# Patient Record
Sex: Female | Born: 1995 | Race: White | Hispanic: No | Marital: Single | Smoking: Former smoker
Health system: Southern US, Community
[De-identification: ages and names within clinical notes are randomized; demographics above are authoritative.]

## PROBLEM LIST (undated history)

## (undated) ENCOUNTER — Inpatient Hospital Stay (HOSPITAL_COMMUNITY): Payer: Self-pay

## (undated) DIAGNOSIS — J020 Streptococcal pharyngitis: Secondary | ICD-10-CM

## (undated) DIAGNOSIS — Z87440 Personal history of urinary (tract) infections: Secondary | ICD-10-CM

## (undated) HISTORY — PX: NO PAST SURGERIES: SHX2092

---
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
## 2014-09-11 ENCOUNTER — Ambulatory Visit (INDEPENDENT_AMBULATORY_CARE_PROVIDER_SITE_OTHER): Payer: BLUE CROSS/BLUE SHIELD | Admitting: Obstetrics & Gynecology

## 2014-09-11 ENCOUNTER — Encounter: Payer: Self-pay | Admitting: Obstetrics & Gynecology

## 2014-09-11 VITALS — BP 128/78 | HR 92 | Ht 68.0 in | Wt 123.2 lb

## 2014-09-11 DIAGNOSIS — Z36 Encounter for antenatal screening of mother: Secondary | ICD-10-CM

## 2014-09-11 DIAGNOSIS — Z3401 Encounter for supervision of normal first pregnancy, first trimester: Secondary | ICD-10-CM

## 2014-09-11 LAB — OB RESULTS CONSOLE GC/CHLAMYDIA
Chlamydia: NEGATIVE
Gonorrhea: NEGATIVE

## 2014-09-11 NOTE — Progress Notes (Signed)
Bedside ultrasound shows CRL of 7 weeks.  This is within a week of her LMP dating [facility] Positive fetal heart rate seen and heard on U/S.

## 2014-09-11 NOTE — Progress Notes (Signed)
   Subjective:    Joanne Newton is a G1P0 [id number] being seen today for her first obstetrical visit.  Her obstetrical history is significant for none. Patient does intend to breast feed. Pregnancy history fully reviewed.  Patient reports no complaints.  Filed Vitals:   09/11/14 0903 09/11/14 0904  BP: 128/78   Pulse: 92   Height:  5\' 8"  (1.727 m)  Weight: 123 lb 3.2 oz (55.883 kg)     HISTORY: OB History  Gravida Para Term Preterm AB SAB TAB Ectopic Multiple Living  1             # Outcome Date GA Lbr Len/2nd Weight Sex Delivery Anes PTL Lv  1 Current              Past Medical History  Diagnosis Date  . Strep pharyngitis   . Otalgia   . History of frequent urinary tract infections    History reviewed. No pertinent past surgical history. History reviewed. No pertinent family history.   Exam    Uterus:     Pelvic Exam:    Perineum: No Hemorrhoids   Vulva: normal   Vagina:  normal mucosa   pH:    Cervix: anteverted   Adnexa: normal adnexa   Bony Pelvis: android  System: Breast:  normal appearance, no masses or tenderness   Skin: normal coloration and turgor, no rashes    Neurologic: oriented   Extremities: normal strength, tone, and muscle mass   HEENT PERRLA   Mouth/Teeth mucous membranes moist, pharynx normal without lesions   Neck supple   Cardiovascular: regular rate and rhythm   Respiratory:  appears well, vitals normal, no respiratory distress, acyanotic, normal RR, ear and throat exam is normal, neck free of mass or lymphadenopathy, chest clear, no wheezing, crepitations, rhonchi, normal symmetric air entry   Abdomen: soft, non-tender; bowel sounds normal; no masses,  no organomegaly   Urinary: urethral meatus normal      Assessment:    Pregnancy: G1P0 There are no active problems to display for this patient.       Plan:     Initial labs drawn. Prenatal vitamins. Problem list reviewed and updated. We will discuss her genetic testing  options at her next visit. Neither she nor FOB have a FH of genetic diseases.  Ultrasound discussed; fetal survey: requested.  Follow up in 4 weeks. Discussed rec'd weight gain in pregnancy Palmer Fahrner C. 09/11/2014

## 2014-09-12 LAB — OBSTETRIC PANEL
Antibody Screen: NEGATIVE
BASOS PCT: 0 % (ref 0–1)
Basophils Absolute: 0 10*3/uL (ref 0.0–0.1)
EOS ABS: 0.2 10*3/uL (ref 0.0–0.7)
Eosinophils Relative: 2 % (ref 0–5)
HCT: 37.5 % (ref 36.0–46.0)
HEMOGLOBIN: 12.4 g/dL (ref 12.0–15.0)
Hepatitis B Surface Ag: NEGATIVE
LYMPHS ABS: 1.9 10*3/uL (ref 0.7–4.0)
Lymphocytes Relative: 23 % (ref 12–46)
MCH: 28.5 pg (ref 26.0–34.0)
MCHC: 33.1 g/dL (ref 30.0–36.0)
MCV: 86.2 fL (ref 78.0–100.0)
MPV: 9.6 fL (ref 8.6–12.4)
Monocytes Absolute: 0.9 10*3/uL (ref 0.1–1.0)
Monocytes Relative: 11 % (ref 3–12)
Neutro Abs: 5.2 10*3/uL (ref 1.7–7.7)
Neutrophils Relative %: 64 % (ref 43–77)
Platelets: 344 10*3/uL (ref 150–400)
RBC: 4.35 MIL/uL (ref 3.87–5.11)
RDW: 12.3 % (ref 11.5–15.5)
Rh Type: POSITIVE
Rubella: 10 Index — ABNORMAL HIGH (ref <0.90)
WBC: 8.2 10*3/uL (ref 4.0–10.5)

## 2014-09-13 LAB — CYSTIC FIBROSIS DIAGNOSTIC STUDY

## 2014-09-13 LAB — GC/CHLAMYDIA PROBE AMP
CT Probe RNA: NEGATIVE
GC PROBE AMP APTIMA: NEGATIVE

## 2014-09-13 LAB — CULTURE, OB URINE
Colony Count: NO GROWTH
Organism ID, Bacteria: NO GROWTH

## 2014-10-09 ENCOUNTER — Ambulatory Visit (INDEPENDENT_AMBULATORY_CARE_PROVIDER_SITE_OTHER): Payer: BLUE CROSS/BLUE SHIELD | Admitting: Family Medicine

## 2014-10-09 VITALS — BP 106/72 | HR 81 | Wt 123.0 lb

## 2014-10-09 DIAGNOSIS — Z3401 Encounter for supervision of normal first pregnancy, first trimester: Secondary | ICD-10-CM

## 2014-10-09 DIAGNOSIS — Z113 Encounter for screening for infections with a predominantly sexual mode of transmission: Secondary | ICD-10-CM

## 2014-10-09 DIAGNOSIS — Z3400 Encounter for supervision of normal first pregnancy, unspecified trimester: Secondary | ICD-10-CM | POA: Insufficient documentation

## 2014-10-09 NOTE — Patient Instructions (Signed)
First Trimester of Pregnancy The first trimester of pregnancy is from week 1 until the end of week 12 (months 1 through 3). A week after a sperm fertilizes an egg, the egg will implant on the wall of the uterus. This embryo will begin to develop into a baby. Genes from you and your partner are forming the baby. The female genes determine whether the baby is a boy or a girl. At 6-8 weeks, the eyes and face are formed, and the heartbeat can be seen on ultrasound. At the end of 12 weeks, all the baby's organs are formed.  Now that you are pregnant, you will want to do everything you can to have a healthy baby. Two of the most important things are to get good prenatal care and to follow your health care provider's instructions. Prenatal care is all the medical care you receive before the baby's birth. This care will help prevent, find, and treat any problems during the pregnancy and childbirth. BODY CHANGES Your body goes through many changes during pregnancy. The changes vary from woman to woman.   You may gain or lose a couple of pounds at first.  You may feel sick to your stomach (nauseous) and throw up (vomit). If the vomiting is uncontrollable, call your health care provider.  You may tire easily.  You may develop headaches that can be relieved by medicines approved by your health care provider.  You may urinate more often. Painful urination may mean you have a bladder infection.  You may develop heartburn as a result of your pregnancy.  You may develop constipation because certain hormones are causing the muscles that push waste through your intestines to slow down.  You may develop hemorrhoids or swollen, bulging veins (varicose veins).  Your breasts may begin to grow larger and become tender. Your nipples may stick out more, and the tissue that surrounds them (areola) may become darker.  Your gums may bleed and may be sensitive to brushing and flossing.  Dark spots or blotches  (chloasma, mask of pregnancy) may develop on your face. This will likely fade after the baby is born.  Your menstrual periods will stop.  You may have a loss of appetite.  You may develop cravings for certain kinds of food.  You may have changes in your emotions from day to day, such as being excited to be pregnant or being concerned that something may go wrong with the pregnancy and baby.  You may have more vivid and strange dreams.  You may have changes in your hair. These can include thickening of your hair, rapid growth, and changes in texture. Some women also have hair loss during or after pregnancy, or hair that feels dry or thin. Your hair will most likely return to normal after your baby is born. WHAT TO EXPECT AT YOUR PRENATAL VISITS During a routine prenatal visit:  You will be weighed to make sure you and the baby are growing normally.  Your blood pressure will be taken.  Your abdomen will be measured to track your baby's growth.  The fetal heartbeat will be listened to starting around week 10 or 12 of your pregnancy.  Test results from any previous visits will be discussed. Your health care provider may ask you:  How you are feeling.  If you are feeling the baby move.  If you have had any abnormal symptoms, such as leaking fluid, bleeding, severe headaches, or abdominal cramping.  If you have any questions. Other tests   that may be performed during your first trimester include:  Blood tests to find your blood type and to check for the presence of any previous infections. They will also be used to check for low iron levels (anemia) and Rh antibodies. Later in the pregnancy, blood tests for diabetes will be done along with other tests if problems develop.  Urine tests to check for infections, diabetes, or protein in the urine.  An ultrasound to confirm the proper growth and development of the baby.  An amniocentesis to check for possible genetic problems.  Fetal  screens for spina bifida and Down syndrome.  You may need other tests to make sure you and the baby are doing well. HOME CARE INSTRUCTIONS  Medicines  Follow your health care provider's instructions regarding medicine use. Specific medicines may be either safe or unsafe to take during pregnancy.  Take your prenatal vitamins as directed.  If you develop constipation, try taking a stool softener if your health care provider approves. Diet  Eat regular, well-balanced meals. Choose a variety of foods, such as meat or vegetable-based protein, fish, milk and low-fat dairy products, vegetables, fruits, and whole grain breads and cereals. Your health care provider will help you determine the amount of weight gain that is right for you.  Avoid raw meat and uncooked cheese. These carry germs that can cause birth defects in the baby.  Eating four or five small meals rather than three large meals a day may help relieve nausea and vomiting. If you start to feel nauseous, eating a few soda crackers can be helpful. Drinking liquids between meals instead of during meals also seems to help nausea and vomiting.  If you develop constipation, eat more high-fiber foods, such as fresh vegetables or fruit and whole grains. Drink enough fluids to keep your urine clear or pale yellow. Activity and Exercise  Exercise only as directed by your health care provider. Exercising will help you:  Control your weight.  Stay in shape.  Be prepared for labor and delivery.  Experiencing pain or cramping in the lower abdomen or low back is a good sign that you should stop exercising. Check with your health care provider before continuing normal exercises.  Try to avoid standing for long periods of time. Move your legs often if you must stand in one place for a long time.  Avoid heavy lifting.  Wear low-heeled shoes, and practice good posture.  You may continue to have sex unless your health care provider directs you  otherwise. Relief of Pain or Discomfort  Wear a good support bra for breast tenderness.   Take warm sitz baths to soothe any pain or discomfort caused by hemorrhoids. Use hemorrhoid cream if your health care provider approves.   Rest with your legs elevated if you have leg cramps or low back pain.  If you develop varicose veins in your legs, wear support hose. Elevate your feet for 15 minutes, 3-4 times a day. Limit salt in your diet. Prenatal Care  Schedule your prenatal visits by the twelfth week of pregnancy. They are usually scheduled monthly at first, then more often in the last 2 months before delivery.  Write down your questions. Take them to your prenatal visits.  Keep all your prenatal visits as directed by your health care provider. Safety  Wear your seat belt at all times when driving.  Make a list of emergency phone numbers, including numbers for family, friends, the hospital, and police and fire departments. General Tips    Ask your health care provider for a referral to a local prenatal education class. Begin classes no later than at the beginning of month 6 of your pregnancy.  Ask for help if you have counseling or nutritional needs during pregnancy. Your health care provider can offer advice or refer you to specialists for help with various needs.  Do not use hot tubs, steam rooms, or saunas.  Do not douche or use tampons or scented sanitary pads.  Do not cross your legs for long periods of time.  Avoid cat litter boxes and soil used by cats. These carry germs that can cause birth defects in the baby and possibly loss of the fetus by miscarriage or stillbirth.  Avoid all smoking, herbs, alcohol, and medicines not prescribed by your health care provider. Chemicals in these affect the formation and growth of the baby.  Schedule a dentist appointment. At home, brush your teeth with a soft toothbrush and be gentle when you floss. SEEK MEDICAL CARE IF:   You have  dizziness.  You have mild pelvic cramps, pelvic pressure, or nagging pain in the abdominal area.  You have persistent nausea, vomiting, or diarrhea.  You have a bad smelling vaginal discharge.  You have pain with urination.  You notice increased swelling in your face, hands, legs, or ankles. SEEK IMMEDIATE MEDICAL CARE IF:   You have a fever.  You are leaking fluid from your vagina.  You have spotting or bleeding from your vagina.  You have severe abdominal cramping or pain.  You have rapid weight gain or loss.  You vomit blood or material that looks like coffee grounds.  You are exposed to German measles and have never had them.  You are exposed to fifth disease or chickenpox.  You develop a severe headache.  You have shortness of breath.  You have any kind of trauma, such as from a fall or a car accident. Document Released: 03/23/2001 Document Revised: 08/13/2013 Document Reviewed: 02/06/2013 [employer] Patient Information 2015 [employer]. This information is not intended to replace advice given to you by your health care provider. Make sure you discuss any questions you have with your health care provider.  Breastfeeding Deciding to breastfeed is one of the best choices you can make for you and your baby. A change in hormones during pregnancy causes your breast tissue to grow and increases the number and size of your milk ducts. These hormones also allow proteins, sugars, and fats from your blood supply to make breast milk in your milk-producing glands. Hormones prevent breast milk from being released before your baby is born as well as prompt milk flow after birth. Once breastfeeding has begun, thoughts of your baby, as well as his or her sucking or crying, can stimulate the release of milk from your milk-producing glands.  BENEFITS OF BREASTFEEDING For Your Baby  Your first milk (colostrum) helps your baby's digestive system function better.   There are antibodies  in your milk that help your baby fight off infections.   Your baby has a lower incidence of asthma, allergies, and sudden infant death syndrome.   The nutrients in breast milk are better for your baby than infant formulas and are designed uniquely for your baby's needs.   Breast milk improves your baby's brain development.   Your baby is less likely to develop other conditions, such as childhood obesity, asthma, or type 2 diabetes mellitus.  For You   Breastfeeding helps to create a very special bond between   you and your baby.   Breastfeeding is convenient. Breast milk is always available at the correct temperature and costs nothing.   Breastfeeding helps to burn calories and helps you lose the weight gained during pregnancy.   Breastfeeding makes your uterus contract to its prepregnancy size faster and slows bleeding (lochia) after you give birth.   Breastfeeding helps to lower your risk of developing type 2 diabetes mellitus, osteoporosis, and breast or ovarian cancer later in life. SIGNS THAT YOUR BABY IS HUNGRY Early Signs of Hunger  Increased alertness or activity.  Stretching.  Movement of the head from side to side.  Movement of the head and opening of the mouth when the corner of the mouth or cheek is stroked (rooting).  Increased sucking sounds, smacking lips, cooing, sighing, or squeaking.  Hand-to-mouth movements.  Increased sucking of fingers or hands. Late Signs of Hunger  Fussing.  Intermittent crying. Extreme Signs of Hunger Signs of extreme hunger will require calming and consoling before your baby will be able to breastfeed successfully. Do not wait for the following signs of extreme hunger to occur before you initiate breastfeeding:   Restlessness.  A loud, strong cry.   Screaming. BREASTFEEDING BASICS Breastfeeding Initiation  Find a comfortable place to sit or lie down, with your neck and back well supported.  Place a pillow or  rolled up blanket under your baby to bring him or her to the level of your breast (if you are seated). Nursing pillows are specially designed to help support your arms and your baby while you breastfeed.  Make sure that your baby's abdomen is facing your abdomen.   Gently massage your breast. With your fingertips, massage from your chest wall toward your nipple in a circular motion. This encourages milk flow. You may need to continue this action during the feeding if your milk flows slowly.  Support your breast with 4 fingers underneath and your thumb above your nipple. Make sure your fingers are well away from your nipple and your baby's mouth.   Stroke your baby's lips gently with your finger or nipple.   When your baby's mouth is open wide enough, quickly bring your baby to your breast, placing your entire nipple and as much of the colored area around your nipple (areola) as possible into your baby's mouth.   More areola should be visible above your baby's upper lip than below the lower lip.   Your baby's tongue should be between his or her lower gum and your breast.   Ensure that your baby's mouth is correctly positioned around your nipple (latched). Your baby's lips should create a seal on your breast and be turned out (everted).  It is common for your baby to suck about 2-3 minutes in order to start the flow of breast milk. Latching Teaching your baby how to latch on to your breast properly is very important. An improper latch can cause nipple pain and decreased milk supply for you and poor weight gain in your baby. Also, if your baby is not latched onto your nipple properly, he or she may swallow some air during feeding. This can make your baby fussy. Burping your baby when you switch breasts during the feeding can help to get rid of the air. However, teaching your baby to latch on properly is still the best way to prevent fussiness from swallowing air while breastfeeding. Signs  that your baby has successfully latched on to your nipple:    Silent tugging or silent   sucking, without causing you pain.   Swallowing heard between every 3-4 sucks.    Muscle movement above and in front of his or her ears while sucking.  Signs that your baby has not successfully latched on to nipple:   Sucking sounds or smacking sounds from your baby while breastfeeding.  Nipple pain. If you think your baby has not latched on correctly, slip your finger into the corner of your baby's mouth to break the suction and place it between your baby's gums. Attempt breastfeeding initiation again. Signs of Successful Breastfeeding Signs from your baby:   A gradual decrease in the number of sucks or complete cessation of sucking.   Falling asleep.   Relaxation of his or her body.   Retention of a small amount of milk in his or her mouth.   Letting go of your breast by himself or herself. Signs from you:  Breasts that have increased in firmness, weight, and size 1-3 hours after feeding.   Breasts that are softer immediately after breastfeeding.  Increased milk volume, as well as a change in milk consistency and color by the fifth day of breastfeeding.   Nipples that are not sore, cracked, or bleeding. Signs That Your Baby is Getting Enough Milk  Wetting at least 3 diapers in a 24-hour period. The urine should be clear and pale yellow by age 5 days.  At least 3 stools in a 24-hour period by age 5 days. The stool should be soft and yellow.  At least 3 stools in a 24-hour period by age 7 days. The stool should be seedy and yellow.  No loss of weight greater than 10% of birth weight during the first 3 days of age.  Average weight gain of 4-7 ounces (113-198 g) per week after age 4 days.  Consistent daily weight gain by age 5 days, without weight loss after the age of 2 weeks. After a feeding, your baby may spit up a small amount. This is common. BREASTFEEDING FREQUENCY AND  DURATION Frequent feeding will help you make more milk and can prevent sore nipples and breast engorgement. Breastfeed when you feel the need to reduce the fullness of your breasts or when your baby shows signs of hunger. This is called "breastfeeding on demand." Avoid introducing a pacifier to your baby while you are working to establish breastfeeding (the first 4-6 weeks after your baby is born). After this time you may choose to use a pacifier. Research has shown that pacifier use during the first year of a baby's life decreases the risk of sudden infant death syndrome (SIDS). Allow your baby to feed on each breast as long as he or she wants. Breastfeed until your baby is finished feeding. When your baby unlatches or falls asleep while feeding from the first breast, offer the second breast. Because newborns are often sleepy in the first few weeks of life, you may need to awaken your baby to get him or her to feed. Breastfeeding times will vary from baby to baby. However, the following rules can serve as a guide to help you ensure that your baby is properly fed:  Newborns (babies 4 weeks of age or younger) may breastfeed every 1-3 hours.  Newborns should not go longer than 3 hours during the day or 5 hours during the night without breastfeeding.  You should breastfeed your baby a minimum of 8 times in a 24-hour period until you begin to introduce solid foods to your baby at around 6   months of age. BREAST MILK PUMPING Pumping and storing breast milk allows you to ensure that your baby is exclusively fed your breast milk, even at times when you are unable to breastfeed. This is especially important if you are going back to work while you are still breastfeeding or when you are not able to be present during feedings. Your lactation consultant can give you guidelines on how long it is safe to store breast milk.  A breast pump is a machine that allows you to pump milk from your breast into a sterile bottle.  The pumped breast milk can then be stored in a refrigerator or freezer. Some breast pumps are operated by hand, while others use electricity. Ask your lactation consultant which type will work best for you. Breast pumps can be purchased, but some hospitals and breastfeeding support groups lease breast pumps on a monthly basis. A lactation consultant can teach you how to hand express breast milk, if you prefer not to use a pump.  CARING FOR YOUR BREASTS WHILE YOU BREASTFEED Nipples can become dry, cracked, and sore while breastfeeding. The following recommendations can help keep your breasts moisturized and healthy:  Avoid using soap on your nipples.   Wear a supportive bra. Although not required, special nursing bras and tank tops are designed to allow access to your breasts for breastfeeding without taking off your entire bra or top. Avoid wearing underwire-style bras or extremely tight bras.  Air dry your nipples for 3-4minutes after each feeding.   Use only cotton bra pads to absorb leaked breast milk. Leaking of breast milk between feedings is normal.   Use lanolin on your nipples after breastfeeding. Lanolin helps to maintain your skin's normal moisture barrier. If you use pure lanolin, you do not need to wash it off before feeding your baby again. Pure lanolin is not toxic to your baby. You may also hand express a few drops of breast milk and gently massage that milk into your nipples and allow the milk to air dry. In the first few weeks after giving birth, some women experience extremely full breasts (engorgement). Engorgement can make your breasts feel heavy, warm, and tender to the touch. Engorgement peaks within 3-5 days after you give birth. The following recommendations can help ease engorgement:  Completely empty your breasts while breastfeeding or pumping. You may want to start by applying warm, moist heat (in the shower or with warm water-soaked hand towels) just before feeding or  pumping. This increases circulation and helps the milk flow. If your baby does not completely empty your breasts while breastfeeding, pump any extra milk after he or she is finished.  Wear a snug bra (nursing or regular) or tank top for 1-2 days to signal your body to slightly decrease milk production.  Apply ice packs to your breasts, unless this is too uncomfortable for you.  Make sure that your baby is latched on and positioned properly while breastfeeding. If engorgement persists after 48 hours of following these recommendations, contact your health care provider or a lactation consultant. OVERALL HEALTH CARE RECOMMENDATIONS WHILE BREASTFEEDING  Eat healthy foods. Alternate between meals and snacks, eating 3 of each per day. Because what you eat affects your breast milk, some of the foods may make your baby more irritable than usual. Avoid eating these foods if you are sure that they are negatively affecting your baby.  Drink milk, fruit juice, and water to satisfy your thirst (about 10 glasses a day).   Rest   often, relax, and continue to take your prenatal vitamins to prevent fatigue, stress, and anemia.  Continue breast self-awareness checks.  Avoid chewing and smoking tobacco.  Avoid alcohol and drug use. Some medicines that may be harmful to your baby can pass through breast milk. It is important to ask your health care provider before taking any medicine, including all over-the-counter and prescription medicine as well as vitamin and herbal supplements. It is possible to become pregnant while breastfeeding. If birth control is desired, ask your health care provider about options that will be safe for your baby. SEEK MEDICAL CARE IF:   You feel like you want to stop breastfeeding or have become frustrated with breastfeeding.  You have painful breasts or nipples.  Your nipples are cracked or bleeding.  Your breasts are red, tender, or warm.  You have a swollen area on either  breast.  You have a fever or chills.  You have nausea or vomiting.  You have drainage other than breast milk from your nipples.  Your breasts do not become full before feedings by the fifth day after you give birth.  You feel sad and depressed.  Your baby is too sleepy to eat well.  Your baby is having trouble sleeping.   Your baby is wetting less than 3 diapers in a 24-hour period.  Your baby has less than 3 stools in a 24-hour period.  Your baby's skin or the white part of his or her eyes becomes yellow.   Your baby is not gaining weight by 5 days of age. SEEK IMMEDIATE MEDICAL CARE IF:   Your baby is overly tired (lethargic) and does not want to wake up and feed.  Your baby develops an unexplained fever. Document Released: 03/29/2005 Document Revised: 04/03/2013 Document Reviewed: 09/20/2012 [employer] Patient Information 2015 [employer]. This information is not intended to replace advice given to you by your health care provider. Make sure you discuss any questions you have with your health care provider.  

## 2014-10-09 NOTE — Progress Notes (Signed)
Subjective:  Joanne Newton is a 19 y.o. G1P0 at 4466w5d being seen today for ongoing prenatal care.  Patient reports no complaints.  Contractions: Not present.  Vag. Bleeding: None. Movement: Absent. Denies leaking of fluid.   The following portions of the patient's history were reviewed and updated as appropriate: allergies, current medications, past family history, past medical history, past social history, past surgical history and problem list.   Objective:   Filed Vitals:   10/09/14 0857  BP: 106/72  Pulse: 81  Weight: 123 lb (55.792 kg)    Fetal Status: Fetal Heart Rate (bpm): 156   Movement: Absent     General:  Alert, oriented and cooperative. Patient is in no acute distress.  Skin: Skin is warm and dry. No rash noted.   Cardiovascular: Normal heart rate noted  Respiratory: Normal respiratory effort, no problems with respiration noted  Abdomen: Soft, gravid, appropriate for gestational age. Pain/Pressure: Absent     Vaginal: Vag. Bleeding: None.    Vag D/C Character: Thin  Extremities: Normal range of motion.  Edema: None  Mental Status: Normal mood and affect. Normal behavior. Normal judgment and thought content.   Urinalysis: Urine Protein: Negative Urine Glucose: Negative  Assessment and Plan:  Pregnancy: G1P0 at 6266w5d  1. Encounter for supervision of normal first pregnancy in first trimester Declines genetic testing  - HIV antibody  Please refer to After Visit Summary for other counseling recommendations.   Return in 4 weeks (on 11/06/2014).   Reva Boresanya S Lynlee Stratton, MD

## 2014-10-10 LAB — HIV ANTIBODY (ROUTINE TESTING W REFLEX): HIV 1&2 Ab, 4th Generation: NONREACTIVE

## 2014-11-07 ENCOUNTER — Encounter: Payer: BLUE CROSS/BLUE SHIELD | Admitting: Obstetrics & Gynecology

## 2015-01-18 ENCOUNTER — Inpatient Hospital Stay (HOSPITAL_COMMUNITY)
Admission: AD | Admit: 2015-01-18 | Discharge: 2015-01-18 | Disposition: A | Discharge status: Home / Self Care | Payer: BLUE CROSS/BLUE SHIELD | Source: Ambulatory Visit | Attending: Obstetrics and Gynecology | Admitting: Obstetrics and Gynecology

## 2015-01-18 ENCOUNTER — Encounter (HOSPITAL_COMMUNITY): Payer: Self-pay | Admitting: *Deleted

## 2015-01-18 DIAGNOSIS — R12 Heartburn: Secondary | ICD-10-CM | POA: Insufficient documentation

## 2015-01-18 DIAGNOSIS — Z3A26 26 weeks gestation of pregnancy: Secondary | ICD-10-CM | POA: Insufficient documentation

## 2015-01-18 DIAGNOSIS — R109 Unspecified abdominal pain: Secondary | ICD-10-CM | POA: Diagnosis present

## 2015-01-18 DIAGNOSIS — O9989 Other specified diseases and conditions complicating pregnancy, childbirth and the puerperium: Secondary | ICD-10-CM

## 2015-01-18 DIAGNOSIS — O26892 Other specified pregnancy related conditions, second trimester: Secondary | ICD-10-CM | POA: Insufficient documentation

## 2015-01-18 DIAGNOSIS — R102 Pelvic and perineal pain: Secondary | ICD-10-CM | POA: Insufficient documentation

## 2015-01-18 DIAGNOSIS — N949 Unspecified condition associated with female genital organs and menstrual cycle: Secondary | ICD-10-CM

## 2015-01-18 DIAGNOSIS — M545 Low back pain: Secondary | ICD-10-CM | POA: Diagnosis not present

## 2015-01-18 DIAGNOSIS — O26899 Other specified pregnancy related conditions, unspecified trimester: Secondary | ICD-10-CM

## 2015-01-18 DIAGNOSIS — O99891 Other specified diseases and conditions complicating pregnancy: Secondary | ICD-10-CM

## 2015-01-18 DIAGNOSIS — M549 Dorsalgia, unspecified: Secondary | ICD-10-CM

## 2015-01-18 LAB — URINALYSIS, ROUTINE W REFLEX MICROSCOPIC
Bilirubin Urine: NEGATIVE
Glucose, UA: NEGATIVE mg/dL
Hgb urine dipstick: NEGATIVE
Ketones, ur: 15 mg/dL — AB
LEUKOCYTES UA: NEGATIVE
NITRITE: NEGATIVE
PROTEIN: NEGATIVE mg/dL
UROBILINOGEN UA: 0.2 mg/dL (ref 0.0–1.0)
pH: 6 (ref 5.0–8.0)

## 2015-01-18 MED ORDER — GI COCKTAIL ~~LOC~~
30.0000 mL | Freq: Once | ORAL | Status: AC
  Administered 2015-01-18: 30 mL via ORAL
  Filled 2015-01-18: qty 30

## 2015-01-18 NOTE — Discharge Instructions (Signed)
Pregnancy support belt Use tylenol as needed per bottle instructions    Round Ligament Pain The round ligament is a cord of muscle and tissue that helps to support the uterus. It can become a source of pain during pregnancy if it becomes stretched or twisted as the baby grows. The pain usually begins in the second trimester of pregnancy, and it can come and go until the baby is delivered. It is not a serious problem, and it does not cause harm to the baby. Round ligament pain is usually a short, sharp, and pinching pain, but it can also be a dull, lingering, and aching pain. The pain is felt in the lower side of the abdomen or in the groin. It usually starts deep in the groin and moves up to the outside of the hip area. Pain can occur with:  A sudden change in position.  Rolling over in bed.  Coughing or sneezing.  Physical activity. HOME CARE INSTRUCTIONS Watch your condition for any changes. Take these steps to help with your pain:  When the pain starts, relax. Then try:  Sitting down.  Flexing your knees up to your abdomen.  Lying on your side with one pillow under your abdomen and another pillow between your legs.  Sitting in a warm bath for 15-20 minutes or until the pain goes away.  Take over-the-counter and prescription medicines only as told by your health care provider.  Move slowly when you sit and stand.  Avoid long walks if they cause pain.  Stop or lessen your physical activities if they cause pain. SEEK MEDICAL CARE IF:  Your pain does not go away with treatment.  You feel pain in your back that you did not have before.  Your medicine is not helping. SEEK IMMEDIATE MEDICAL CARE IF:  You develop a fever or chills.  You develop uterine contractions.  You develop vaginal bleeding.  You develop nausea or vomiting.  You develop diarrhea.  You have pain when you urinate.   This information is not intended to replace advice given to you by your health  care provider. Make sure you discuss any questions you have with your health care provider.   Document Released: 01/06/2008 Document Revised: 06/21/2011 Document Reviewed: 06/05/2014 Elsevier Interactive Patient Education 2016 Else[employer] Inc[employer]   Heartburn During Pregnancy Heartburn is a burning sensation in the chest caused by stomach acid backing up into the esophagus. Heartburn is common in pregnancy because a certain hormone (progesterone) is released when a woman is pregnant. The progesterone hormone may relax the valve that separates the esophagus from the stomach. This allows acid to go up into the esophagus, causing heartburn. Heartburn may also happen in pregnancy because the enlarging uterus pushes up on the stomach, which pushes more acid into the esophagus. This is especially true in the later stages of pregnancy. Heartburn problems usually go away after giving birth. CAUSES  Heartburn is caused by stomach acid backing up into the esophagus. During pregnancy, this may result from various things, including:   The progesterone hormone.  Changing hormone levels.  The growing uterus pushing stomach acid upward.  Large meals.  Certain foods and drinks.  Exercise.  Increased acid production. SIGNS AND SYMPTOMS   Burning pain in the chest or lower throat.  Bitter taste in the mouth.  Coughing. DIAGNOSIS  Your health care provider will typically diagnose heartburn by taking a careful history of your concern. Blood tests may be done to check for a certain type  of bacteria that is associated with heartburn. Sometimes, heartburn is diagnosed by prescribing a heartburn medicine to see if the symptoms improve. In some cases, a procedure called an endoscopy may be done. In this procedure, a tube with a light and a camera on the end (endoscope) is used to examine the esophagus and the stomach. TREATMENT  Treatment will vary depending on the severity of your symptoms. Your health care  provider may recommend:  Over-the-counter medicines (antacids, acid reducers) for mild heartburn.  Prescription medicines to decrease stomach acid or to protect your stomach lining.  Certain changes in your diet.  Elevating the head of your bed by putting blocks under the legs. This helps prevent stomach acid from backing up into the esophagus when you are lying down. HOME CARE INSTRUCTIONS   Only take over-the-counter or prescription medicines as directed by your health care provider.  Raise the head of your bed by putting blocks under the legs if instructed to do so by your health care provider. Sleeping with more pillows is not effective because it only changes the position of your head.  Do not exercise right after eating.  Avoid eating 2-3 hours before bed. Do not lie down right after eating.  Eat small meals throughout the day instead of three large meals.  Identify foods and beverages that make your symptoms worse and avoid them. Foods you may want to avoid include:  Peppers.  Chocolate.  High-fat foods, including fried foods.  Spicy foods.  Garlic and onions.  Citrus fruits, including oranges, grapefruit, lemons, and limes.  Food containing tomatoes or tomato products.  Mint.  Carbonated and caffeinated drinks.  Vinegar. SEEK MEDICAL CARE IF:  You have abdominal pain of any kind.  You feel burning in your upper abdomen or chest, especially after eating or lying down.  You have nausea and vomiting.  Your stomach feels upset after you eat. SEEK IMMEDIATE MEDICAL CARE IF:   You have severe chest pain that goes down your arm or into your jaw or neck.  You feel sweaty, dizzy, or light-headed.  You become short of breath.  You vomit blood.  You have difficulty or pain with swallowing.  You have bloody or black, tarry stools.  You have episodes of heartburn more than 3 times a week, for more than 2 weeks. MAKE SURE YOU:  Understand these  instructions.  Will watch your condition.  Will get help right away if you are not doing well or get worse.   This information is not intended to replace advice given to you by your health care provider. Make sure you discuss any questions you have with your health care provider.   Document Released: 03/26/2000 Document Revised: 04/19/2014 Document Reviewed: 11/15/2012 Elsevier Interactive Patient Education 2016 [employer].    Preterm Labor Information Preterm labor is when labor starts at less than 37 weeks of pregnancy. The normal length of a pregnancy is 39 to 41 weeks. CAUSES Often, there is no identifiable underlying cause as to why a woman goes into preterm labor. One of the most common known causes of preterm labor is infection. Infections of the uterus, cervix, vagina, amniotic sac, bladder, kidney, or even the lungs (pneumonia) can cause labor to start. Other suspected causes of preterm labor include:   Urogenital infections, such as yeast infections and bacterial vaginosis.   Uterine abnormalities (uterine shape, uterine septum, fibroids, or bleeding from the placenta).   A cervix that has been operated on (it may fail  to stay closed).   Malformations in the fetus.   Multiple gestations (twins, triplets, and so on).   Breakage of the amniotic sac.  RISK FACTORS  Having a previous history of preterm labor.   Having premature rupture of membranes (PROM).   Having a placenta that covers the opening of the cervix (placenta previa).   Having a placenta that separates from the uterus (placental abruption).   Having a cervix that is too weak to hold the fetus in the uterus (incompetent cervix).   Having too much fluid in the amniotic sac (polyhydramnios).   Taking illegal drugs or smoking while pregnant.   Not gaining enough weight while pregnant.   Being younger than 25 and older than 20 years old.   Having a low socioeconomic status.   Being  African American. SYMPTOMS Signs and symptoms of preterm labor include:   Menstrual-like cramps, abdominal pain, or back pain.  Uterine contractions that are regular, as frequent as six in an hour, regardless of their intensity (may be mild or painful).  Contractions that start on the top of the uterus and spread down to the lower abdomen and back.   A sense of increased pelvic pressure.   A watery or bloody mucus discharge that comes from the vagina.  TREATMENT Depending on the length of the pregnancy and other circumstances, your health care provider may suggest bed rest. If necessary, there are medicines that can be given to stop contractions and to mature the fetal lungs. If labor happens before 34 weeks of pregnancy, a prolonged hospital stay may be recommended. Treatment depends on the condition of both you and the fetus.  WHAT SHOULD YOU DO IF YOU THINK YOU ARE IN PRETERM LABOR? Call your health care provider right away. You will need to go to the hospital to get checked immediately. HOW CAN YOU PREVENT PRETERM LABOR IN FUTURE PREGNANCIES? You should:   Stop smoking if you smoke.  Maintain healthy weight gain and avoid chemicals and drugs that are not necessary.  Be watchful for any type of infection.  Inform your health care provider if you have a known history of preterm labor.   This information is not intended to replace advice given to you by your health care provider. Make sure you discuss any questions you have with your health care provider.   Document Released: 06/19/2003 Document Revised: 11/29/2012 Document Reviewed: 05/01/2012 Elsevier Interactive Patient Education [employer]

## 2015-01-18 NOTE — MAU Provider Note (Signed)
History     CSN: [id number]  Arrival date and time: 01/18/15 2110   First Provider Initiated Contact with Patient 01/18/15 2137         Chief Complaint  Patient presents with  . Back Pain  . Abdominal Pain   HPI  Joanne Newton is a 19 y.o. G1P0 at [redacted]w[redacted]d who presents for back & abdominal pain. Abdominal pain since 630 pm while at work. Works as a Child psychotherapist. Pain is constant lower abdominal pain that is sharp & cramp like. Rates as 6/10. Has not treated.  Also reports lower back pain that feels like muscle spasms.  Denies vaginal bleeding or LOF.  Positive fetal movement.  Last had intercourse at 1 am this morning. Denies n/v/d/constipation.  Pain worse when bending over.    OB History    Gravida Para Term Preterm AB TAB SAB Ectopic Multiple Living   1               Past Medical History  Diagnosis Date  . Strep pharyngitis   . Otalgia   . History of frequent urinary tract infections     No past surgical history on file.  No family history on file.  Social History  Substance Use Topics  . Smoking status: Current Some Day Smoker -- 0.25 packs/day for 4 years    Types: Cigarettes  . Smokeless tobacco: Never Used  . Alcohol Use: No    Allergies: No Known Allergies  Prescriptions prior to admission  Medication Sig Dispense Refill Last Dose  . CALCIUM PO Take by mouth.   Taking  . Prenatal Vit-Fe Fumarate-FA (MULTIVITAMIN-PRENATAL) 27-0.8 MG TABS tablet Take 1 tablet by mouth daily at 12 noon.   Taking    Review of Systems  Constitutional: Negative.   HENT: Negative.   Respiratory: Negative.   Cardiovascular: Negative.   Gastrointestinal: Positive for heartburn and abdominal pain. Negative for nausea, vomiting, diarrhea and constipation.  Genitourinary: Negative.   Musculoskeletal: Positive for back pain.   Physical Exam   Blood pressure 115/69, pulse 88, temperature 97.9 F (36.6 C), temperature source Oral, resp. rate 18, height  (1.727 m),  weight 140 lb (63.504 kg), last menstrual period 07/19/2014.  Physical Exam  Nursing note and vitals reviewed. Constitutional: She is oriented to person, place, and time. She appears well-developed and well-nourished. No distress.  HENT:  Head: Normocephalic and atraumatic.  Eyes: Conjunctivae are normal. Right eye exhibits no discharge. Left eye exhibits no discharge. No scleral icterus.  Neck: Normal range of motion.  Cardiovascular: Normal rate, regular rhythm and normal heart sounds.   No murmur heard. Respiratory: Effort normal and breath sounds normal. No respiratory distress. She has no wheezes.  GI: Soft. There is no tenderness.  Musculoskeletal: Normal range of motion. She exhibits no tenderness.  Neurological: She is alert and oriented to person, place, and time.  Skin: Skin is warm and dry. She is not diaphoretic.  Psychiatric: She has a normal mood and affect. Her behavior is normal. Judgment and thought content normal.   Dilation: Closed Effacement (%): Thick Cervical Position: Middle Station: -3 Exam by:: Judeth Horn NP   Fetal Tracing:  Baseline: 135 Variability: moderate Accelerations: 10x10 Decelerations: few small variables  Toco: none   MAU Course  Procedures Results for orders placed or performed during the hospital encounter of 01/18/15 (from the past 24 hour(s))  Urinalysis, Routine w reflex microscopic (not at [facility])     Status: Abnormal   Collection  Time: 01/18/15  9:15 PM  Result Value Ref Range   Color, Urine YELLOW YELLOW   APPearance CLEAR CLEAR   Specific Gravity, Urine >1.030 (H) 1.005 - 1.030   pH 6.0 5.0 - 8.0   Glucose, UA NEGATIVE NEGATIVE mg/dL   Hgb urine dipstick NEGATIVE NEGATIVE   Bilirubin Urine NEGATIVE NEGATIVE   Ketones, ur 15 (A) NEGATIVE mg/dL   Protein, ur NEGATIVE NEGATIVE mg/dL   Urobilinogen, UA 0.2 0.0 - 1.0 mg/dL   Nitrite NEGATIVE NEGATIVE   Leukocytes, UA NEGATIVE NEGATIVE    MDM PO fluids GI cocktail for  heartburn - symptoms improved 2230- C/w Dr. Arelia Sneddon; ok to discharge home Assessment and Plan  A: 1. Pain of round ligament affecting pregnancy, antepartum   2. Back pain affecting pregnancy   3. Heartburn during pregnancy in second trimester    P: Discharge home Preterm labor precautions Recommend maternity support belt Increase fluid intake Take tylenol PRN per bottle instructions  Judeth Horn, NP  01/18/2015, 9:30 PM

## 2015-01-18 NOTE — MAU Note (Signed)
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  Denies leaking fluid, vaginal bleeding or d/c

## 2015-04-13 NOTE — L&D Delivery Note (Signed)
SVD of VFI at 1258 on 05/03/15.  EBL 100cc.  APGARs 9,9.  Placenta to L&D. Head delivered LOA and body followed atraumatically.  Cord was clamped, cut and baby to abdomen.  Cord blood was obtained.  Placenta delivered S/I/3VC.  Fundus was firmed with pitocin and massage.  Bilateral periurethral lacs were repaired with 3-0 Rapide in the normal fashion.  Mom and baby stable.   Mitchel Honour, DO

## 2015-04-25 ENCOUNTER — Inpatient Hospital Stay (HOSPITAL_COMMUNITY)
Admission: AD | Admit: 2015-04-25 | Discharge: 2015-04-25 | Disposition: A | Discharge status: Home / Self Care | Payer: BLUE CROSS/BLUE SHIELD | Source: Ambulatory Visit | Attending: Obstetrics and Gynecology | Admitting: Obstetrics and Gynecology

## 2015-04-25 ENCOUNTER — Encounter (HOSPITAL_COMMUNITY): Payer: Self-pay | Admitting: *Deleted

## 2015-04-25 DIAGNOSIS — Z87891 Personal history of nicotine dependence: Secondary | ICD-10-CM | POA: Diagnosis not present

## 2015-04-25 DIAGNOSIS — O26899 Other specified pregnancy related conditions, unspecified trimester: Secondary | ICD-10-CM | POA: Diagnosis present

## 2015-04-25 DIAGNOSIS — K529 Noninfective gastroenteritis and colitis, unspecified: Secondary | ICD-10-CM | POA: Insufficient documentation

## 2015-04-25 DIAGNOSIS — R1110 Vomiting, unspecified: Secondary | ICD-10-CM | POA: Diagnosis not present

## 2015-04-25 DIAGNOSIS — Z3A40 40 weeks gestation of pregnancy: Secondary | ICD-10-CM | POA: Diagnosis not present

## 2015-04-25 DIAGNOSIS — O212 Late vomiting of pregnancy: Secondary | ICD-10-CM | POA: Diagnosis not present

## 2015-04-25 LAB — URINE MICROSCOPIC-ADD ON

## 2015-04-25 LAB — URINALYSIS, ROUTINE W REFLEX MICROSCOPIC
Bilirubin Urine: NEGATIVE
GLUCOSE, UA: NEGATIVE mg/dL
HGB URINE DIPSTICK: NEGATIVE
Ketones, ur: NEGATIVE mg/dL
Nitrite: NEGATIVE
PROTEIN: 30 mg/dL — AB
Specific Gravity, Urine: >1.03 — ABNORMAL HIGH (ref 1.005–1.030)
pH: 6 (ref 5.0–8.0)

## 2015-04-25 MED ORDER — DEXTROSE 5 % IN LACTATED RINGERS IV BOLUS
1000.0000 mL | Freq: Once | INTRAVENOUS | Status: AC
  Administered 2015-04-25: 1000 mL via INTRAVENOUS

## 2015-04-25 MED ORDER — PROMETHAZINE HCL 25 MG/ML IJ SOLN
25.0000 mg | INTRAMUSCULAR | Status: AC
Start: 2015-04-25 — End: 2015-04-25
  Administered 2015-04-25: 25 mg via INTRAVENOUS
  Filled 2015-04-25: qty 1

## 2015-04-25 MED ORDER — SODIUM CHLORIDE 0.9 % IV SOLN: 25.0000 mg | Freq: Once | INTRAVENOUS | Status: DC

## 2015-04-25 MED ORDER — PROMETHAZINE HCL 12.5 MG PO TABS: 12.5000 mg | ORAL_TABLET | Freq: Four times a day (QID) | ORAL | Status: DC | PRN

## 2015-04-25 NOTE — MAU Note (Signed)
No further vomiting. Patient states she wasn't really nauseated, she just feels pain in stomach and suddenly vomits. States she feels ok now. Up to BR to void.

## 2015-04-25 NOTE — MAU Note (Signed)
Patient presents with abdominal pain this morning, then about 9:30 started with vomiting and diarrhea non stop since then.

## 2015-04-25 NOTE — Discharge Instructions (Signed)

## 2015-04-25 NOTE — MAU Provider Note (Signed)
History     CSN: [id number]  Arrival date and time: 04/25/15 1134   None     Chief Complaint  Patient presents with  . Emesis During Pregnancy  . Diarrhea   HPI Joanne Newton 20 y.o. G1P0 @[redacted]w[redacted]d  presents complaining of vomiting and diarrhea just this morning.  She has vomited 7-8 times since 9am.  She had diarrhea 4 times with vomiting.  She had last meal last night.  She does not "feel" bad or really even nauseous.  She denies fever, dysuria, LOF, ROM, vaginal bleeding contractions.  She reports good fetal movement.   OB History    Gravida Para Term Preterm AB TAB SAB Ectopic Multiple Living   1               Past Medical History  Diagnosis Date  . Strep pharyngitis   . Otalgia   . History of frequent urinary tract infections     Past Surgical History  Procedure Laterality Date  . No past surgeries      History reviewed. No pertinent family history.  Social History  Substance Use Topics  . Smoking status: Former Smoker -- 0.25 packs/day for 4 years    Types: Cigarettes  . Smokeless tobacco: Never Used  . Alcohol Use: No    Allergies: No Known Allergies  Prescriptions prior to admission  Medication Sig Dispense Refill Last Dose  . Prenatal Vit-Fe Fumarate-FA (MULTIVITAMIN-PRENATAL) 27-0.8 MG TABS tablet Take 1 tablet by mouth daily.    01/18/2015 at Unknown time    ROS Pertinent ROS in HPI.  All other systems are negative.   Physical Exam   Blood pressure 117/84, pulse 97, temperature 97.8 F (36.6 C), temperature source Oral, resp. rate 18, height 5\' 8"  (1.727 m), weight 158 lb 9.6 oz (71.94 kg), last menstrual period 07/19/2014.  Physical Exam  Constitutional: She is oriented to person, place, and time. She appears well-developed. She appears distressed.  HENT:  Head: Normocephalic and atraumatic.  Eyes: Conjunctivae and EOM are normal.  Neck: Normal range of motion. No thyromegaly present.  Cardiovascular: Normal rate.   Respiratory: Effort  normal. No respiratory distress.  GI: Soft. She exhibits no distension.  Musculoskeletal: Normal range of motion.  Neurological: She is alert and oriented to person, place, and time.  Skin: Skin is warm and dry.  Psychiatric: She has a normal mood and affect. Her behavior is normal.   Results for orders placed or performed during the hospital encounter of 04/25/15 (from the past 24 hour(s))  Urinalysis, Routine w reflex microscopic (not at [facility])     Status: Abnormal   Collection Time: 04/25/15 11:45 AM  Result Value Ref Range   Color, Urine YELLOW YELLOW   APPearance CLOUDY (A) CLEAR   Specific Gravity, Urine >1.030 (H) 1.005 - 1.030   pH 6.0 5.0 - 8.0   Glucose, UA NEGATIVE NEGATIVE mg/dL   Hgb urine dipstick NEGATIVE NEGATIVE   Bilirubin Urine NEGATIVE NEGATIVE   Ketones, ur NEGATIVE NEGATIVE mg/dL   Protein, ur 30 (A) NEGATIVE mg/dL   Nitrite NEGATIVE NEGATIVE   Leukocytes, UA TRACE (A) NEGATIVE  Urine microscopic-add on     Status: Abnormal   Collection Time: 04/25/15 11:45 AM  Result Value Ref Range   Squamous Epithelial / LPF 6-30 (A) NONE SEEN   WBC, UA 0-5 0 - 5 WBC/hpf   RBC / HPF 0-5 0 - 5 RBC/hpf   Bacteria, UA FEW (A) NONE SEEN   Crystals  CA OXALATE CRYSTALS (A) NEGATIVE   Urine-Other MUCOUS PRESENT    Fetal Tracing: Baseline:120 Variability:mod Accelerations: 15x15 x 2 Decelerations:none Toco:occasional ctx - nonpainful   MAU Course  Procedures  MDM IV fluids ordered with 25mg  IV Phenergan push.  Reactive fetal tracing Pt notes feeling improved.  No vomiting since medication initiated.   Dr. Rana SnareLowe agreeable with plan for pt and discharge with po phenergan for home use. Assessment and Plan  A:  1. Vomiting of pregnancy, after 22 weeks, antepartum   2. Gastroenteritis    P: Discharge to home PO phenergan 12.5mg  rx provided for prn use Pt to f/u in clinic as scheduled, as needed Labor precautions discussed Patient may return to MAU as needed or if her  condition were to change or worsen   Bertram DenverKaren E Teague Clark 04/25/2015, 12:22 PM

## 2015-04-25 NOTE — MAU Note (Addendum)
Patient sipping water at intervals. Vomited approx 200cc fluid prior to phenergan.

## 2015-05-01 ENCOUNTER — Ambulatory Visit (INDEPENDENT_AMBULATORY_CARE_PROVIDER_SITE_OTHER): Payer: Self-pay | Admitting: Pediatrics

## 2015-05-01 DIAGNOSIS — Z3490 Encounter for supervision of normal pregnancy, unspecified, unspecified trimester: Secondary | ICD-10-CM

## 2015-05-01 DIAGNOSIS — Z7681 Expectant parent(s) prebirth pediatrician visit: Secondary | ICD-10-CM

## 2015-05-01 NOTE — Progress Notes (Signed)
Prenatal counseling for impending newborn done-- Z76.81  

## 2015-05-02 ENCOUNTER — Encounter (HOSPITAL_COMMUNITY): Payer: Self-pay | Admitting: *Deleted

## 2015-05-02 ENCOUNTER — Telehealth (HOSPITAL_COMMUNITY): Payer: Self-pay | Admitting: *Deleted

## 2015-05-02 LAB — OB RESULTS CONSOLE GBS: STREP GROUP B AG: NEGATIVE

## 2015-05-02 NOTE — Telephone Encounter (Signed)
Preadmission screen  

## 2015-05-03 ENCOUNTER — Inpatient Hospital Stay (HOSPITAL_COMMUNITY)
Admission: AD | Admit: 2015-05-03 | Discharge: 2015-05-04 | DRG: 775 | Disposition: A | Discharge status: Home / Self Care | Payer: BLUE CROSS/BLUE SHIELD | Source: Ambulatory Visit | Attending: Obstetrics & Gynecology | Admitting: Obstetrics & Gynecology

## 2015-05-03 ENCOUNTER — Inpatient Hospital Stay (HOSPITAL_COMMUNITY): Admission: RE | Admit: 2015-05-03 | Payer: BLUE CROSS/BLUE SHIELD | Source: Ambulatory Visit

## 2015-05-03 ENCOUNTER — Encounter (HOSPITAL_COMMUNITY): Payer: Self-pay

## 2015-05-03 ENCOUNTER — Inpatient Hospital Stay (HOSPITAL_COMMUNITY): Payer: BLUE CROSS/BLUE SHIELD | Admitting: Anesthesiology

## 2015-05-03 DIAGNOSIS — Z87891 Personal history of nicotine dependence: Secondary | ICD-10-CM

## 2015-05-03 DIAGNOSIS — K219 Gastro-esophageal reflux disease without esophagitis: Secondary | ICD-10-CM | POA: Diagnosis present

## 2015-05-03 DIAGNOSIS — Z87440 Personal history of urinary (tract) infections: Secondary | ICD-10-CM

## 2015-05-03 DIAGNOSIS — Z3490 Encounter for supervision of normal pregnancy, unspecified, unspecified trimester: Secondary | ICD-10-CM

## 2015-05-03 DIAGNOSIS — Z3A40 40 weeks gestation of pregnancy: Secondary | ICD-10-CM | POA: Diagnosis not present

## 2015-05-03 DIAGNOSIS — O9962 Diseases of the digestive system complicating childbirth: Secondary | ICD-10-CM | POA: Diagnosis present

## 2015-05-03 LAB — TYPE AND SCREEN
ABO/RH(D): A POS
Antibody Screen: NEGATIVE

## 2015-05-03 LAB — CBC
HCT: 31.6 % — ABNORMAL LOW (ref 36.0–46.0)
Hemoglobin: 10.3 g/dL — ABNORMAL LOW (ref 12.0–15.0)
MCH: 27.4 pg (ref 26.0–34.0)
MCHC: 32.6 g/dL (ref 30.0–36.0)
MCV: 84 fL (ref 78.0–100.0)
PLATELETS: 288 10*3/uL (ref 150–400)
RBC: 3.76 MIL/uL — AB (ref 3.87–5.11)
RDW: 13.5 % (ref 11.5–15.5)
WBC: 15.2 10*3/uL — AB (ref 4.0–10.5)

## 2015-05-03 LAB — ABO/RH: ABO/RH(D): A POS

## 2015-05-03 LAB — RPR: RPR: NONREACTIVE

## 2015-05-03 MED ORDER — DIPHENHYDRAMINE HCL 25 MG PO CAPS
25.0000 mg | ORAL_CAPSULE | Freq: Four times a day (QID) | ORAL | Status: DC | PRN
Start: 2015-05-03 — End: 2015-05-04

## 2015-05-03 MED ORDER — PRENATAL MULTIVITAMIN CH
1.0000 | ORAL_TABLET | Freq: Every day | ORAL | Status: DC
  Administered 2015-05-04: 1 via ORAL
  Filled 2015-05-03: qty 1

## 2015-05-03 MED ORDER — ZOLPIDEM TARTRATE 5 MG PO TABS: 5.0000 mg | ORAL_TABLET | Freq: Every evening | ORAL | Status: DC | PRN

## 2015-05-03 MED ORDER — MISOPROSTOL 25 MCG QUARTER TABLET
25.0000 ug | ORAL_TABLET | ORAL | Status: DC | PRN
  Administered 2015-05-03: 25 ug via VAGINAL
  Filled 2015-05-03 (×2): qty 0.25
  Filled 2015-05-03: qty 1

## 2015-05-03 MED ORDER — FLEET ENEMA 7-19 GM/118ML RE ENEM: 1.0000 | ENEMA | RECTAL | Status: DC | PRN

## 2015-05-03 MED ORDER — EPHEDRINE 5 MG/ML INJ
10.0000 mg | INTRAVENOUS | Status: DC | PRN
  Filled 2015-05-03: qty 2
  Filled 2015-05-03: qty 4

## 2015-05-03 MED ORDER — OXYCODONE-ACETAMINOPHEN 5-325 MG PO TABS
2.0000 | ORAL_TABLET | ORAL | Status: DC | PRN
Start: 2015-05-03 — End: 2015-05-03

## 2015-05-03 MED ORDER — ONDANSETRON HCL 4 MG/2ML IJ SOLN
4.0000 mg | INTRAMUSCULAR | Status: DC | PRN
Start: 2015-05-03 — End: 2015-05-04

## 2015-05-03 MED ORDER — TETANUS-DIPHTH-ACELL PERTUSSIS 5-2.5-18.5 LF-MCG/0.5 IM SUSP: 0.5000 mL | Freq: Once | INTRAMUSCULAR | Status: DC

## 2015-05-03 MED ORDER — ACETAMINOPHEN 325 MG PO TABS: 650.0000 mg | ORAL_TABLET | ORAL | Status: DC | PRN

## 2015-05-03 MED ORDER — DIBUCAINE 1 % RE OINT
1.0000 | TOPICAL_OINTMENT | RECTAL | Status: DC | PRN
Start: 2015-05-03 — End: 2015-05-04

## 2015-05-03 MED ORDER — LIDOCAINE HCL (PF) 1 % IJ SOLN
30.0000 mL | INTRAMUSCULAR | Status: DC | PRN
  Filled 2015-05-03: qty 30

## 2015-05-03 MED ORDER — BENZOCAINE-MENTHOL 20-0.5 % EX AERO
1.0000 "application " | INHALATION_SPRAY | CUTANEOUS | Status: DC | PRN
  Filled 2015-05-03: qty 56

## 2015-05-03 MED ORDER — FENTANYL 2.5 MCG/ML BUPIVACAINE 1/10 % EPIDURAL INFUSION (WH - ANES)
14.0000 mL/h | INTRAMUSCULAR | Status: DC | PRN
  Administered 2015-05-03: 14 mL/h via EPIDURAL
  Filled 2015-05-03: qty 125

## 2015-05-03 MED ORDER — LACTATED RINGERS IV SOLN
2.5000 [IU]/h | INTRAVENOUS | Status: DC
  Filled 2015-05-03: qty 4

## 2015-05-03 MED ORDER — OXYTOCIN 10 UNIT/ML IJ SOLN
1.0000 m[IU]/min | INTRAVENOUS | Status: DC
  Administered 2015-05-03: 2 m[IU]/min via INTRAVENOUS

## 2015-05-03 MED ORDER — PHENYLEPHRINE 40 MCG/ML (10ML) SYRINGE FOR IV PUSH (FOR BLOOD PRESSURE SUPPORT)
80.0000 ug | PREFILLED_SYRINGE | INTRAVENOUS | Status: DC | PRN
  Filled 2015-05-03: qty 2
  Filled 2015-05-03: qty 20

## 2015-05-03 MED ORDER — ONDANSETRON HCL 4 MG PO TABS: 4.0000 mg | ORAL_TABLET | ORAL | Status: DC | PRN

## 2015-05-03 MED ORDER — TERBUTALINE SULFATE 1 MG/ML IJ SOLN
0.2500 mg | Freq: Once | INTRAMUSCULAR | Status: DC | PRN
  Filled 2015-05-03: qty 1

## 2015-05-03 MED ORDER — SIMETHICONE 80 MG PO CHEW: 80.0000 mg | CHEWABLE_TABLET | ORAL | Status: DC | PRN

## 2015-05-03 MED ORDER — LACTATED RINGERS IV SOLN
500.0000 mL | INTRAVENOUS | Status: DC | PRN
  Administered 2015-05-03: 500 mL via INTRAVENOUS

## 2015-05-03 MED ORDER — OXYTOCIN BOLUS FROM INFUSION: 500.0000 mL | INTRAVENOUS | Status: DC

## 2015-05-03 MED ORDER — WITCH HAZEL-GLYCERIN EX PADS
1.0000 | MEDICATED_PAD | CUTANEOUS | Status: DC | PRN
Start: 2015-05-03 — End: 2015-05-04

## 2015-05-03 MED ORDER — LACTATED RINGERS IV SOLN
INTRAVENOUS | Status: DC
  Administered 2015-05-03: 1000 mL via INTRAVENOUS
  Administered 2015-05-03: 125 mL/h via INTRAVENOUS
  Administered 2015-05-03: 1000 mL via INTRAVENOUS

## 2015-05-03 MED ORDER — CITRIC ACID-SODIUM CITRATE 334-500 MG/5ML PO SOLN
30.0000 mL | ORAL | Status: DC | PRN
  Administered 2015-05-03: 30 mL via ORAL
  Filled 2015-05-03: qty 15

## 2015-05-03 MED ORDER — IBUPROFEN 600 MG PO TABS
600.0000 mg | ORAL_TABLET | Freq: Four times a day (QID) | ORAL | Status: DC
  Administered 2015-05-03 – 2015-05-04 (×4): 600 mg via ORAL
  Filled 2015-05-03 (×4): qty 1

## 2015-05-03 MED ORDER — OXYCODONE-ACETAMINOPHEN 5-325 MG PO TABS: 1.0000 | ORAL_TABLET | ORAL | Status: DC | PRN

## 2015-05-03 MED ORDER — BUTORPHANOL TARTRATE 1 MG/ML IJ SOLN
1.0000 mg | INTRAMUSCULAR | Status: DC | PRN
  Administered 2015-05-03 (×2): 1 mg via INTRAVENOUS
  Filled 2015-05-03 (×3): qty 1

## 2015-05-03 MED ORDER — LANOLIN HYDROUS EX OINT: TOPICAL_OINTMENT | CUTANEOUS | Status: DC | PRN

## 2015-05-03 MED ORDER — SENNOSIDES-DOCUSATE SODIUM 8.6-50 MG PO TABS
2.0000 | ORAL_TABLET | ORAL | Status: DC
  Administered 2015-05-03: 2 via ORAL
  Filled 2015-05-03: qty 2

## 2015-05-03 MED ORDER — LIDOCAINE HCL (PF) 1 % IJ SOLN
INTRAMUSCULAR | Status: DC | PRN
  Administered 2015-05-03: 2 mL via EPIDURAL
  Administered 2015-05-03: 3 mL via EPIDURAL
  Administered 2015-05-03: 5 mL via EPIDURAL

## 2015-05-03 MED ORDER — OXYCODONE-ACETAMINOPHEN 5-325 MG PO TABS
2.0000 | ORAL_TABLET | ORAL | Status: DC | PRN
  Administered 2015-05-04: 2 via ORAL
  Filled 2015-05-03: qty 2

## 2015-05-03 MED ORDER — ONDANSETRON HCL 4 MG/2ML IJ SOLN
4.0000 mg | Freq: Four times a day (QID) | INTRAMUSCULAR | Status: DC | PRN
  Administered 2015-05-03: 4 mg via INTRAVENOUS
  Filled 2015-05-03: qty 2

## 2015-05-03 MED ORDER — DIPHENHYDRAMINE HCL 50 MG/ML IJ SOLN: 12.5000 mg | INTRAMUSCULAR | Status: DC | PRN

## 2015-05-03 NOTE — Anesthesia Preprocedure Evaluation (Signed)
Anesthesia Evaluation  Patient identified by MRN, date of birth, ID band Patient awake    Reviewed: Allergy & Precautions, NPO status , Patient's Chart, lab work & pertinent test results  Airway Mallampati: II  TM Distance: >3 FB Neck ROM: Full    Dental  (+) Teeth Intact, Dental Advisory Given, Chipped,    Pulmonary former smoker,    Pulmonary exam normal breath sounds clear to auscultation       Cardiovascular negative cardio ROS Normal cardiovascular exam Rhythm:Regular Rate:Normal     Neuro/Psych negative neurological ROS  negative psych ROS   GI/Hepatic Neg liver ROS, GERD  Medicated,  Endo/Other  negative endocrine ROS  Renal/GU negative Renal ROS     Musculoskeletal negative musculoskeletal ROS (+)   Abdominal   Peds  Hematology  (+) Blood dyscrasia, anemia , Plt 288k   Anesthesia Other Findings Day of surgery medications reviewed with the patient.  Reproductive/Obstetrics (+) Pregnancy                             Anesthesia Physical Anesthesia Plan  ASA: II  Anesthesia Plan: Epidural   Post-op Pain Management:    Induction:   Airway Management Planned:   Additional Equipment:   Intra-op Plan:   Post-operative Plan:   Informed Consent: I have reviewed the patients History and Physical, chart, labs and discussed the procedure including the risks, benefits and alternatives for the proposed anesthesia with the patient or authorized representative who has indicated his/her understanding and acceptance.   Dental advisory given  Plan Discussed with:   Anesthesia Plan Comments: (Patient identified. Risks/Benefits/Options discussed with patient including but not limited to bleeding, infection, nerve damage, paralysis, failed block, incomplete pain control, headache, blood pressure changes, nausea, vomiting, reactions to medication both or allergic, itching and postpartum  back pain. Confirmed with bedside nurse the patient's most recent platelet count. Confirmed with patient that they are not currently taking any anticoagulation, have any bleeding history or any family history of bleeding disorders. Patient expressed understanding and wished to proceed. All questions were answered. )        Anesthesia Quick Evaluation

## 2015-05-03 NOTE — Anesthesia Procedure Notes (Signed)
Epidural Patient location during procedure: OB  Staffing Anesthesiologist: Ansley Mangiapane EDWARD Performed by: anesthesiologist   Preanesthetic Checklist Completed: patient identified, pre-op evaluation, timeout performed, IV checked, risks and benefits discussed and monitors and equipment checked  Epidural Patient position: sitting Prep: DuraPrep Patient monitoring: blood pressure and continuous pulse ox Approach: midline Location: L3-L4 Injection technique: LOR air  Needle:  Needle type: Tuohy  Needle gauge: 17 G Needle length: 9 cm Needle insertion depth: 4 cm Catheter size: 19 Gauge Catheter at skin depth: 9 cm Test dose: negative and Other (1% Lidocaine)  Additional Notes Patient identified.  Risk benefits discussed including failed block, incomplete pain control, headache, nerve damage, paralysis, blood pressure changes, nausea, vomiting, reactions to medication both toxic or allergic, and postpartum back pain.  Patient expressed understanding and wished to proceed.  All questions were answered.  Sterile technique used throughout procedure and epidural site dressed with sterile barrier dressing. No paresthesia or other complications noted. The patient did not experience any signs of intravascular injection such as tinnitus or metallic taste in mouth nor signs of intrathecal spread such as rapid motor block. Please see nursing notes for vital signs. Reason for block:procedure for pain   

## 2015-05-03 NOTE — H&P (Signed)
Joanne Newton is a 20 y.o. female presenting for [facility] (edc 05/01/15).  Antepartum course uncomplicated.  GBS negative.  Patient was admitted last night and received one VMP.  She progressed well and when time to place a second, was 2 cm.  Pitocin was started.  On rounds, the patient is sitting up for epidural secondary to pain unrelieved by Stadol.    Maternal Medical History:  Contractions: Onset was 6-12 hours ago.   Frequency: irregular.   Perceived severity is moderate.    Fetal activity: Perceived fetal activity is normal.   Last perceived fetal movement was within the past hour.    Prenatal complications: no prenatal complications Prenatal Complications - Diabetes: none.    OB History    Gravida Para Term Preterm AB TAB SAB Ectopic Multiple Living   1              Past Medical History  Diagnosis Date  . Strep pharyngitis   . Otalgia   . History of frequent urinary tract infections    Past Surgical History  Procedure Laterality Date  . No past surgeries     Family History: family history includes Cancer in her paternal grandmother. Social History:  reports that she quit smoking about 6 months ago. Her smoking use included Cigarettes. She has a 1 pack-year smoking history. She has never used smokeless tobacco. She reports that she uses illicit drugs (Marijuana). She reports that she does not drink alcohol.   Prenatal Transfer Tool  Maternal Diabetes: No Genetic Screening: Normal Maternal Ultrasounds/Referrals: Normal Fetal Ultrasounds or other Referrals:  None Maternal Substance Abuse:  No Significant Maternal Medications:  None Significant Maternal Lab Results:  Lab values include: Group B Strep negative Other Comments:  None  ROS  Dilation: 2.5 Effacement (%): 70 Station: -2 Exam by:: Dherr rn Blood pressure 127/100, pulse 94, temperature 97.8 F (36.6 C), temperature source Oral, resp. rate 18, height  (1.727 m), weight 74.39 kg (164 lb), last  menstrual period 07/19/2014. Maternal Exam:  Uterine Assessment: Contraction strength is moderate.  Contraction frequency is regular.   Abdomen: Patient reports no abdominal tenderness. Fundal height is c/w dates.   Estimated fetal weight is 7#8.       Physical Exam  Constitutional: She is oriented to person, place, and time. She appears well-developed and well-nourished. No distress.  GI: Soft. There is no rebound and no guarding.  Neurological: She is alert and oriented to person, place, and time.  Skin: Skin is warm and dry.  Psychiatric: She has a normal mood and affect. Her behavior is normal.    Prenatal labs: ABO, Rh: --/--/A POS, A POS (01/21 0045) Antibody: NEG (01/21 0045) Rubella: 10.00 (06/01 1128) RPR: NON REAC (06/01 1128)  HBsAg: NEGATIVE (06/01 1128)  HIV: NONREACTIVE (06/29 0928)  GBS: Negative (01/20 0000)   Assessment/Plan: 19yo G1 at [redacted]w[redacted]d for [facility] -Getting epidural -Anticipate NSVD   Zachary Lovins 05/03/2015, 8:31 AM

## 2015-05-03 NOTE — Lactation Note (Signed)
This note was copied from the chart of Joanne [place]. Lactation Consultation Note Initial visit at 8 hours of age.  Mom reports several good feedings and denies nipple pain.  Mom reports a 60 minutes feeding with baby active on one breast.  Encouraged mom to switch breast after about 30 minutes to prevent soreness.  Previous LATCH scores of "9-10."  [facility] LC resources given and discussed.  Encouraged to feed with early cues on demand.  Early newborn behavior discussed.  Hand reports hand expression with colostrum visible and RN assisted with previous spoon feeding of .  Mom to call for assist as needed.    Patient Name: Joanne Newton Today's Date: 05/03/2015 Reason for consult: Initial assessment   Maternal Data Has patient been taught Hand Expression?: Yes Does the patient have breastfeeding experience prior to this delivery?: No  Feeding Feeding Type: Breast Fed Length of feed: 35 min  LATCH Score/Interventions Latch: Grasps breast easily, tongue down, lips flanged, rhythmical sucking.  Audible Swallowing: Spontaneous and intermittent  Type of Nipple: Everted at rest and after stimulation  Comfort (Breast/Nipple): Soft / non-tender     Hold (Positioning): Assistance needed to correctly position infant at breast and maintain latch.  LATCH Score: 9  Lactation Tools Discussed/Used     Consult Status Consult Status: Follow-up Date: 05/04/15 Follow-up type: In-patient    Jannifer Rodney 05/03/2015, 9:02 PM

## 2015-05-03 NOTE — Anesthesia Postprocedure Evaluation (Signed)
Anesthesia Post Note  Patient: Joanne Newton  Procedure(s) Performed: * No procedures listed *  Patient location during evaluation: Mother Baby Anesthesia Type: Epidural Level of consciousness: awake Pain management: pain level controlled Vital Signs Assessment: post-procedure vital signs reviewed and stable Respiratory status: spontaneous breathing Cardiovascular status: stable Postop Assessment: no headache, no backache, epidural receding, patient able to bend at knees, no signs of nausea or vomiting and adequate PO intake Anesthetic complications: no    Last Vitals:  Filed Vitals:   05/03/15 1401 05/03/15 1500  BP: 119/89 109/75  Pulse: 93 74  Temp:  36.5 C  Resp:      Last Pain:  Filed Vitals:   05/03/15 1622  PainSc: 0-No pain                 Shealeigh Dunstan

## 2015-05-04 LAB — CBC
HCT: 29.6 % — ABNORMAL LOW (ref 36.0–46.0)
Hemoglobin: 9.6 g/dL — ABNORMAL LOW (ref 12.0–15.0)
MCH: 27.4 pg (ref 26.0–34.0)
MCHC: 32.4 g/dL (ref 30.0–36.0)
MCV: 84.6 fL (ref 78.0–100.0)
PLATELETS: 244 10*3/uL (ref 150–400)
RBC: 3.5 MIL/uL — AB (ref 3.87–5.11)
RDW: 13.6 % (ref 11.5–15.5)
WBC: 15.1 10*3/uL — ABNORMAL HIGH (ref 4.0–10.5)

## 2015-05-04 MED ORDER — IBUPROFEN 600 MG PO TABS: 600.0000 mg | ORAL_TABLET | Freq: Four times a day (QID) | ORAL | Status: AC

## 2015-05-04 MED ORDER — OXYCODONE-ACETAMINOPHEN 5-325 MG PO TABS: 1.0000 | ORAL_TABLET | ORAL | Status: DC | PRN

## 2015-05-04 NOTE — Progress Notes (Signed)
Post Partum Day 1 Subjective: no complaints, up ad lib, voiding and tolerating PO  Objective: Blood pressure 103/63, pulse 74, temperature 97.7 F (36.5 C), temperature source Oral, resp. rate 18, height  (1.727 m), weight 74.39 kg (164 lb), last menstrual period 07/19/2014, SpO2 100 %, unknown if currently breastfeeding.  Physical Exam:  General: alert, cooperative and appears stated age Lochia: appropriate Uterine Fundus: firm Incision: healing well, no significant drainage, no dehiscence DVT Evaluation: No evidence of DVT seen on physical exam. Negative Homan's sign. No cords or calf tenderness.   Recent Labs  05/03/15 0045 05/04/15 0510  HGB 10.3* 9.6*  HCT 31.6* 29.6*    Assessment/Plan: Plan for discharge tomorrow and Breastfeeding   LOS: 1 day   Joanne Newton 05/04/2015, 10:08 AM

## 2015-05-04 NOTE — Discharge Instructions (Signed)
Call MD for T>100.4, heavy vaginal bleeding, severe abdominal pain, or respiratory distress.  Call office to schedule postpartum visit in 4-6 weeks.  No driving while taking narcotics.  Pelvic rest x 6 weeks.  Use sitz baths prn perineal pain.

## 2015-05-04 NOTE — Discharge Summary (Signed)
Obstetric Discharge Summary Reason for Admission: induction of labor Prenatal Procedures: none Intrapartum Procedures: spontaneous vaginal delivery Postpartum Procedures: none Complications-Operative and Postpartum: bilateral periurethral laceration HEMOGLOBIN  Date Value Ref Range Status  05/04/2015 9.6* 12.0 - 15.0 g/dL Final   HCT  Date Value Ref Range Status  05/04/2015 29.6* 36.0 - 46.0 % Final    Physical Exam:  General: alert, cooperative and appears stated age Lochia: appropriate Uterine Fundus: firm Incision: healing well, no significant drainage, no dehiscence DVT Evaluation: No evidence of DVT seen on physical exam. Negative Homan's sign. No cords or calf tenderness.  Discharge Diagnoses: Term Pregnancy-delivered  Discharge Information: Date: 05/04/2015 Activity: pelvic rest Diet: routine Medications: PNV, Ibuprofen and Percocet Condition: stable Instructions: refer to practice specific booklet Discharge to: home   Newborn Data: Live born female  Birth Weight: 6 lb 15 oz (3147 g) APGAR: 9, 9  Home with mother.  Joanne Newton 05/04/2015, 12:37 PM

## 2015-05-04 NOTE — Progress Notes (Signed)
Patient has requested early discharge.  As she is meeting all goals, will d/c home today.   Mitchel Honour, DO

## 2015-05-07 ENCOUNTER — Inpatient Hospital Stay (HOSPITAL_COMMUNITY): Payer: BLUE CROSS/BLUE SHIELD

## 2017-08-21 ENCOUNTER — Encounter (HOSPITAL_COMMUNITY): Payer: Self-pay | Admitting: Emergency Medicine

## 2017-08-21 ENCOUNTER — Ambulatory Visit (HOSPITAL_COMMUNITY)
Admission: EM | Admit: 2017-08-21 | Discharge: 2017-08-21 | Disposition: A | Discharge status: Home / Self Care | Payer: BLUE CROSS/BLUE SHIELD | Attending: Internal Medicine | Admitting: Internal Medicine

## 2017-08-21 DIAGNOSIS — B9789 Other viral agents as the cause of diseases classified elsewhere: Secondary | ICD-10-CM

## 2017-08-21 DIAGNOSIS — J069 Acute upper respiratory infection, unspecified: Secondary | ICD-10-CM | POA: Diagnosis not present

## 2017-08-21 MED ORDER — CETIRIZINE-PSEUDOEPHEDRINE ER 5-120 MG PO TB12: 1.0000 | ORAL_TABLET | Freq: Every day | ORAL | 0 refills | Status: AC

## 2017-08-21 NOTE — ED Provider Notes (Signed)
[facility]   [id number] 08/21/17 Arrival Time: 1904  SUBJECTIVE: History from: patient.  Joanne Newton is a 22 y.o. female who presents with abrupt onset of nasal congestion, sore throat, and dry cough for 3 days.  Admits to positive sick exposure.  Has NOT tried OTC medications.  Denies aggravating symptoms.  Reports previous symptoms in the past.   Complains of rhinorrhea.  Denies fever, chills, fatigue, sinus pain, SOB, wheezing, chest pain, nausea, changes in bowel or bladder habits.    Received flu shot this year: no.  ROS: As per HPI.  Past Medical History:  Diagnosis Date  . History of frequent urinary tract infections   . Otalgia   . Strep pharyngitis    Past Surgical History:  Procedure Laterality Date  . NO PAST SURGERIES     No Known Allergies  Outpatient Medications   ibuprofen (ADVIL,MOTRIN) 600 MG tablet    oxyCODONE-acetaminophen (PERCOCET/ROXICET) 5-325 MG tablet    Prenatal Vit-Fe Fumarate-FA (MULTIVITAMIN-PRENATAL) 27-0.8 MG TABS tablet     Social History   Socioeconomic History  . Marital status: Single    Spouse name: Not on file  . Number of children: Not on file  . Years of education: Not on file  . Highest education level: Not on file  Occupational History  . Not on file  Social Needs  . Financial resource strain: Not on file  . Food insecurity:    Worry: Not on file    Inability: Not on file  . Transportation needs:    Medical: Not on file    Non-medical: Not on file  Tobacco Use  . Smoking status: Former Smoker    Packs/day: 0.25    Years: 4.00    Pack years: 1.00    Types: Cigarettes    Last attempt to quit: 10/30/2014    Years since quitting: 2.8  . Smokeless tobacco: Never Used  Substance and Sexual Activity  . Alcohol use: No  . Drug use: Yes    Types: Marijuana    Comment: occasional last use prior to july 2016  . Sexual activity: Yes  Lifestyle  . Physical activity:    Days per week: Not on file   Minutes per session: Not on file  . Stress: Not on file  Relationships  . Social connections:    Talks on phone: Not on file    Gets together: Not on file    Attends religious service: Not on file    Active member of club or organization: Not on file    Attends meetings of clubs or organizations: Not on file    Relationship status: Not on file  . Intimate partner violence:    Fear of current or ex partner: Not on file    Emotionally abused: Not on file    Physically abused: Not on file    Forced sexual activity: Not on file  Other Topics Concern  . Not on file  Social History Narrative  . Not on file   Family History  Problem Relation Age of Onset  . Cancer Paternal Grandmother        ovarian    OBJECTIVE:  Vitals:   08/21/17 1958  BP: 123/87  Pulse: 96  Resp: 18  Temp: 98.6 F (37 C)  TempSrc: Oral  SpO2: 100%     General appearance: alert; appears fatigued HEENT: Ears: EACs clear, TMs pearly gray with visible cone of light, without erythema; Eyes: PERRL, EOMI grossly; Sinuses nontender  to palpation; Nose: no rhinorrhea; Throat: oropharynx mildly erythematous, tonsils 2+ without white tonsillar exudates, uvula midline Neck: supple without LAD Lungs: CTA bilaterally without adventitious breath sounds Heart: regular rate and rhythm.  Radial pulses 2+ symmetrical bilaterally Skin: warm and dry Psychological: alert and cooperative; normal mood and affect  Imaging: No results found.  ASSESSMENT & PLAN:  1. Viral URI with cough     Meds ordered this encounter  Medications  . cetirizine-pseudoephedrine (ZYRTEC-D) 5-120 MG tablet    Sig: Take 1 tablet by mouth daily.    Dispense:  30 tablet    Refill:  0    Order Specific Question:   Supervising Provider    Answer:   Isa Rankin [[id number]]   Declines rapid strep at this time Get plenty of rest and push fluids Zyrtec-D prescribed take as directed Use OTC medication as needed for symptomatic  relief Follow up with PCP if symptoms persist Return or go to ER if you have any new or worsening sympto   Reviewed expectations re: course of current medical issues. Questions answered. Outlined signs and symptoms indicating need for more acute intervention. Patient verbalized understanding. After Visit Summary given.         Rennis Harding, PA-C 08/21/17 2015

## 2017-08-21 NOTE — Discharge Instructions (Addendum)
Get plenty of rest and push fluids Zyrtec-D prescribed take as directed Use OTC medication as needed for symptomatic relief Follow up with PCP if symptoms persist Return or go to ER if you have any new or worsening symptoms

## 2017-08-21 NOTE — ED Triage Notes (Signed)
Pt sts URI sx and needs work note

## 2017-09-13 ENCOUNTER — Emergency Department (HOSPITAL_COMMUNITY)
Admission: EM | Admit: 2017-09-13 | Discharge: 2017-09-13 | Disposition: A | Discharge status: Home / Self Care | Payer: BLUE CROSS/BLUE SHIELD | Attending: Emergency Medicine | Admitting: Emergency Medicine

## 2017-09-13 ENCOUNTER — Encounter (HOSPITAL_COMMUNITY): Payer: Self-pay | Admitting: Emergency Medicine

## 2017-09-13 ENCOUNTER — Emergency Department (HOSPITAL_COMMUNITY): Payer: BLUE CROSS/BLUE SHIELD

## 2017-09-13 DIAGNOSIS — Y9389 Activity, other specified: Secondary | ICD-10-CM | POA: Diagnosis not present

## 2017-09-13 DIAGNOSIS — Z87891 Personal history of nicotine dependence: Secondary | ICD-10-CM | POA: Diagnosis not present

## 2017-09-13 DIAGNOSIS — Y929 Unspecified place or not applicable: Secondary | ICD-10-CM | POA: Diagnosis not present

## 2017-09-13 DIAGNOSIS — S6991XA Unspecified injury of right wrist, hand and finger(s), initial encounter: Secondary | ICD-10-CM | POA: Insufficient documentation

## 2017-09-13 DIAGNOSIS — Y999 Unspecified external cause status: Secondary | ICD-10-CM | POA: Insufficient documentation

## 2017-09-13 DIAGNOSIS — T1490XA Injury, unspecified, initial encounter: Secondary | ICD-10-CM

## 2017-09-13 NOTE — ED Notes (Signed)
Ortho paged. 

## 2017-09-13 NOTE — Progress Notes (Signed)
Orthopedic Tech Progress Note Patient Details:  Joanne Newton [birth year] [id number]  Ortho Devices Type of Ortho Device: Ace wrap, Thumb spica splint Splint Material: Plaster Ortho Device/Splint Location: rue Ortho Device/Splint Interventions: Application   Post Interventions Patient Tolerated: Well Instructions Provided: Care of device   Nikki DomCrawford, Amma Crear 09/13/2017, 2:54 PM
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
## 2017-09-13 NOTE — Discharge Instructions (Signed)
Keep your splint clean and dry and applied until you see the hand doctor.  You can take ibuprofen and Tylenol, as needed for pain, as prescribed over-the-counter.  Please follow-up with Dr. Amanda PeaGramig in 7 to 10 days for repeat x-ray and further evaluation of your injury.  Please return to the emergency department if you develop any new or worsening symptoms.

## 2017-09-13 NOTE — ED Notes (Signed)
Patient transported to X-ray 

## 2020-03-23 ENCOUNTER — Emergency Department (HOSPITAL_COMMUNITY)
Admission: EM | Admit: 2020-03-23 | Discharge: 2020-03-24 | Discharge status: Against Medical Advice | Payer: BC Managed Care – PPO | Attending: Emergency Medicine | Admitting: Emergency Medicine

## 2020-03-23 ENCOUNTER — Other Ambulatory Visit: Payer: Self-pay

## 2020-03-23 DIAGNOSIS — Z87891 Personal history of nicotine dependence: Secondary | ICD-10-CM | POA: Diagnosis not present

## 2020-03-23 DIAGNOSIS — R000 Tachycardia, unspecified: Secondary | ICD-10-CM | POA: Insufficient documentation

## 2020-03-23 DIAGNOSIS — S0101XA Laceration without foreign body of scalp, initial encounter: Secondary | ICD-10-CM | POA: Diagnosis not present

## 2020-03-23 DIAGNOSIS — M549 Dorsalgia, unspecified: Secondary | ICD-10-CM | POA: Insufficient documentation

## 2020-03-23 DIAGNOSIS — S0000XA Unspecified superficial injury of scalp, initial encounter: Secondary | ICD-10-CM | POA: Diagnosis present

## 2020-03-23 DIAGNOSIS — Z23 Encounter for immunization: Secondary | ICD-10-CM | POA: Diagnosis not present

## 2020-03-23 DIAGNOSIS — T1490XA Injury, unspecified, initial encounter: Secondary | ICD-10-CM

## 2020-03-23 DIAGNOSIS — M542 Cervicalgia: Secondary | ICD-10-CM | POA: Diagnosis not present

## 2020-03-23 MED ORDER — IBUPROFEN 800 MG PO TABS: 800.0000 mg | ORAL_TABLET | Freq: Three times a day (TID) | ORAL | 0 refills | Status: AC

## 2020-03-23 MED ORDER — FENTANYL CITRATE (PF) 100 MCG/2ML IJ SOLN
50.0000 ug | Freq: Once | INTRAMUSCULAR | Status: DC
Start: 2020-03-23 — End: 2020-03-24

## 2020-03-23 MED ORDER — CYCLOBENZAPRINE HCL 10 MG PO TABS: 10.0000 mg | ORAL_TABLET | Freq: Three times a day (TID) | ORAL | 0 refills | Status: DC | PRN

## 2020-03-23 MED ORDER — CEPHALEXIN 500 MG PO CAPS: 500.0000 mg | ORAL_CAPSULE | Freq: Four times a day (QID) | ORAL | 0 refills | Status: AC

## 2020-03-23 MED ORDER — TETANUS-DIPHTH-ACELL PERTUSSIS 5-2.5-18.5 LF-MCG/0.5 IM SUSY: 0.5000 mL | PREFILLED_SYRINGE | Freq: Once | INTRAMUSCULAR | Status: DC
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
## 2020-03-23 NOTE — ED Notes (Addendum)
Patient refusing all blood draws   Wants to leave AMA, MD made aware.   Patient educated on risks of leaving, large open laceration to forehead.   Patient verbalized understanding of risks and wants to leave AMA.
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
## 2020-03-24 ENCOUNTER — Emergency Department (HOSPITAL_COMMUNITY): Payer: BC Managed Care – PPO

## 2020-03-24 ENCOUNTER — Encounter (HOSPITAL_COMMUNITY): Payer: Self-pay

## 2020-03-24 ENCOUNTER — Other Ambulatory Visit: Payer: Self-pay

## 2020-03-24 ENCOUNTER — Emergency Department (HOSPITAL_COMMUNITY)
Admission: EM | Admit: 2020-03-24 | Discharge: 2020-03-24 | Disposition: A | Discharge status: Home / Self Care | Payer: BC Managed Care – PPO | Attending: Emergency Medicine | Admitting: Emergency Medicine

## 2020-03-24 DIAGNOSIS — M546 Pain in thoracic spine: Secondary | ICD-10-CM | POA: Insufficient documentation

## 2020-03-24 DIAGNOSIS — R52 Pain, unspecified: Secondary | ICD-10-CM

## 2020-03-24 DIAGNOSIS — S0012XA Contusion of left eyelid and periocular area, initial encounter: Secondary | ICD-10-CM | POA: Diagnosis not present

## 2020-03-24 DIAGNOSIS — Z87891 Personal history of nicotine dependence: Secondary | ICD-10-CM | POA: Diagnosis not present

## 2020-03-24 DIAGNOSIS — M6281 Muscle weakness (generalized): Secondary | ICD-10-CM | POA: Diagnosis not present

## 2020-03-24 DIAGNOSIS — Y92410 Unspecified street and highway as the place of occurrence of the external cause: Secondary | ICD-10-CM | POA: Insufficient documentation

## 2020-03-24 DIAGNOSIS — M542 Cervicalgia: Secondary | ICD-10-CM

## 2020-03-24 DIAGNOSIS — R29898 Other symptoms and signs involving the musculoskeletal system: Secondary | ICD-10-CM

## 2020-03-24 DIAGNOSIS — R519 Headache, unspecified: Secondary | ICD-10-CM | POA: Diagnosis not present

## 2020-03-24 DIAGNOSIS — S129XXA Fracture of neck, unspecified, initial encounter: Secondary | ICD-10-CM | POA: Insufficient documentation

## 2020-03-24 DIAGNOSIS — Z23 Encounter for immunization: Secondary | ICD-10-CM | POA: Insufficient documentation

## 2020-03-24 DIAGNOSIS — S199XXA Unspecified injury of neck, initial encounter: Secondary | ICD-10-CM | POA: Diagnosis present

## 2020-03-24 LAB — COMPREHENSIVE METABOLIC PANEL
ALT: 14 U/L (ref 0–44)
AST: 70 U/L — ABNORMAL HIGH (ref 15–41)
Albumin: 3.6 g/dL (ref 3.5–5.0)
Alkaline Phosphatase: 47 U/L (ref 38–126)
Anion gap: 11 (ref 5–15)
BUN: 11 mg/dL (ref 6–20)
CO2: 21 mmol/L — ABNORMAL LOW (ref 22–32)
Calcium: 9 mg/dL (ref 8.9–10.3)
Chloride: 103 mmol/L (ref 98–111)
Creatinine, Ser: 0.64 mg/dL (ref 0.44–1.00)
GFR, Estimated: >60 mL/min (ref >60)
Glucose, Bld: 94 mg/dL (ref 70–99)
Potassium: 5.6 mmol/L — ABNORMAL HIGH (ref 3.5–5.1)
Sodium: 135 mmol/L (ref 135–145)
Total Bilirubin: 1.9 mg/dL — ABNORMAL HIGH (ref 0.3–1.2)
Total Protein: 6.3 g/dL — ABNORMAL LOW (ref 6.5–8.1)

## 2020-03-24 LAB — I-STAT CHEM 8, ED
BUN: 10 mg/dL (ref 6–20)
Calcium, Ion: 1.23 mmol/L (ref 1.15–1.40)
Chloride: 104 mmol/L (ref 98–111)
Creatinine, Ser: 0.5 mg/dL (ref 0.44–1.00)
Glucose, Bld: 95 mg/dL (ref 70–99)
HCT: 40 % (ref 36.0–46.0)
Hemoglobin: 13.6 g/dL (ref 12.0–15.0)
Potassium: 3.7 mmol/L (ref 3.5–5.1)
Sodium: 138 mmol/L (ref 135–145)
TCO2: 23 mmol/L (ref 22–32)

## 2020-03-24 LAB — CBC
HCT: 38.9 % (ref 36.0–46.0)
Hemoglobin: 12.3 g/dL (ref 12.0–15.0)
MCH: 28.1 pg (ref 26.0–34.0)
MCHC: 31.6 g/dL (ref 30.0–36.0)
MCV: 88.8 fL (ref 80.0–100.0)
Platelets: 297 10*3/uL (ref 150–400)
RBC: 4.38 MIL/uL (ref 3.87–5.11)
RDW: 12.7 % (ref 11.5–15.5)
WBC: 8.9 10*3/uL (ref 4.0–10.5)
nRBC: 0 % (ref 0.0–0.2)

## 2020-03-24 LAB — HCG, SERUM, QUALITATIVE: Preg, Serum: NEGATIVE

## 2020-03-24 LAB — I-STAT BETA HCG BLOOD, ED (MC, WL, AP ONLY): I-stat hCG, quantitative: <5 m[IU]/mL (ref <5)

## 2020-03-24 LAB — ETHANOL: Alcohol, Ethyl (B): <10 mg/dL (ref <10)

## 2020-03-24 LAB — SAMPLE TO BLOOD BANK

## 2020-03-24 MED ORDER — CYCLOBENZAPRINE HCL 10 MG PO TABS: 10.0000 mg | ORAL_TABLET | Freq: Three times a day (TID) | ORAL | 0 refills | Status: AC

## 2020-03-24 MED ORDER — DIAZEPAM 5 MG PO TABS
5.0000 mg | ORAL_TABLET | Freq: Once | ORAL | Status: AC
  Administered 2020-03-24: 16:00:00 5 mg via ORAL
  Filled 2020-03-24: qty 1

## 2020-03-24 MED ORDER — MORPHINE SULFATE (PF) 4 MG/ML IV SOLN
4.0000 mg | Freq: Once | INTRAVENOUS | Status: AC
  Administered 2020-03-24: 16:00:00 4 mg via INTRAVENOUS
  Filled 2020-03-24: qty 1

## 2020-03-24 MED ORDER — HYDROCODONE-ACETAMINOPHEN 5-325 MG PO TABS: 1.0000 | ORAL_TABLET | Freq: Four times a day (QID) | ORAL | 0 refills | Status: AC | PRN

## 2020-03-24 MED ORDER — ONDANSETRON HCL 4 MG/2ML IJ SOLN
4.0000 mg | Freq: Once | INTRAMUSCULAR | Status: AC
  Administered 2020-03-24: 12:00:00 4 mg via INTRAVENOUS
  Filled 2020-03-24: qty 2

## 2020-03-24 MED ORDER — IOHEXOL 300 MG/ML  SOLN
100.0000 mL | Freq: Once | INTRAMUSCULAR | Status: AC | PRN
  Administered 2020-03-24: 100 mL via INTRAVENOUS

## 2020-03-24 MED ORDER — SODIUM CHLORIDE 0.9 % IV BOLUS
1000.0000 mL | Freq: Once | INTRAVENOUS | Status: AC
  Administered 2020-03-24: 16:00:00 1000 mL via INTRAVENOUS

## 2020-03-24 MED ORDER — TETANUS-DIPHTH-ACELL PERTUSSIS 5-2.5-18.5 LF-MCG/0.5 IM SUSY
0.5000 mL | PREFILLED_SYRINGE | Freq: Once | INTRAMUSCULAR | Status: AC
  Administered 2020-03-24: 12:00:00 0.5 mL via INTRAMUSCULAR
  Filled 2020-03-24: qty 0.5

## 2020-03-24 MED ORDER — MORPHINE SULFATE (PF) 4 MG/ML IV SOLN
4.0000 mg | Freq: Once | INTRAVENOUS | Status: AC
  Administered 2020-03-24: 12:00:00 4 mg via INTRAVENOUS
  Filled 2020-03-24: qty 1

## 2020-03-24 NOTE — ED Notes (Signed)
Patient transported to X-ray 
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
## 2020-03-24 NOTE — Discharge Instructions (Addendum)
I have prescribed a short course of muscle relaxers to help with your pain.  Please be aware this medication may make you drowsy.  The second medication is a narcotic, do not take this medication while drinking alcohol or driving.  Please schedule an appointment with Dr. Julio Sicks in order to follow-up with him in 2 weeks.
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
